# Patient Record
Sex: Male | Born: 1965 | Race: White | Hispanic: No | Marital: Married | Smoking: Never smoker
Health system: Southern US, Community
[De-identification: ages and names within clinical notes are randomized; demographics above are authoritative.]

---

## 2007-10-12 ENCOUNTER — Ambulatory Visit: Payer: Self-pay | Admitting: Gastroenterology

## 2007-11-17 ENCOUNTER — Ambulatory Visit: Payer: Self-pay | Admitting: Gastroenterology

## 2008-08-24 ENCOUNTER — Ambulatory Visit: Payer: Self-pay | Admitting: Family Medicine

## 2008-09-06 ENCOUNTER — Ambulatory Visit: Payer: Self-pay | Admitting: Surgery

## 2009-02-28 ENCOUNTER — Ambulatory Visit: Payer: Self-pay | Admitting: Gastroenterology

## 2010-03-23 ENCOUNTER — Emergency Department: Payer: Self-pay | Admitting: Emergency Medicine

## 2011-04-10 ENCOUNTER — Ambulatory Visit: Payer: Self-pay | Admitting: Family Medicine

## 2011-05-21 ENCOUNTER — Emergency Department: Payer: Self-pay | Admitting: Emergency Medicine

## 2011-05-24 ENCOUNTER — Ambulatory Visit: Payer: Self-pay | Admitting: Gastroenterology

## 2011-05-30 ENCOUNTER — Ambulatory Visit: Payer: Self-pay | Admitting: Gastroenterology

## 2012-11-04 IMAGING — CT CT ABD-PELV W/ CM
1 of 2 series · 15 of 32 positions shown, 19 images · non-contrast
Comparison: none

REASON FOR EXAM: Abd Pelvic Pain
COMMENTS:

PROCEDURE:     KCT - KCT ABDOMEN/PELVIS W  - April 10, 2011  [DATE]
RESULT:     Comparison is made to a prior study dated 09/06/2008.
TECHNIQUE: Helical 5 mm sections were obtained from the lung bases through
the pubic symphysis status post intravenous administration of 85 mL of
[id number].

[Series 3: abd with 5.0 i40f 3 · axial · 0.73mm/px · z∈[-664,-190]mm · 15 of 105 slices shown, 19 images]
[im 5/105  soft-tissue]
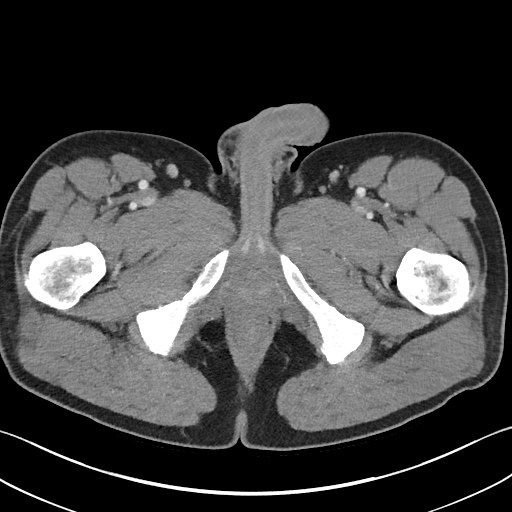
[im 5/105  bone]
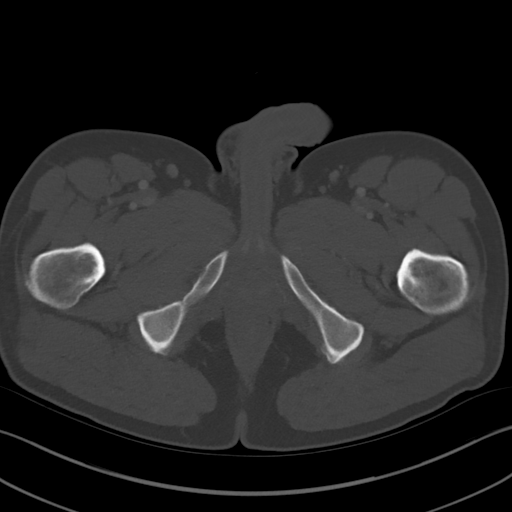
[im 14/105  soft-tissue]
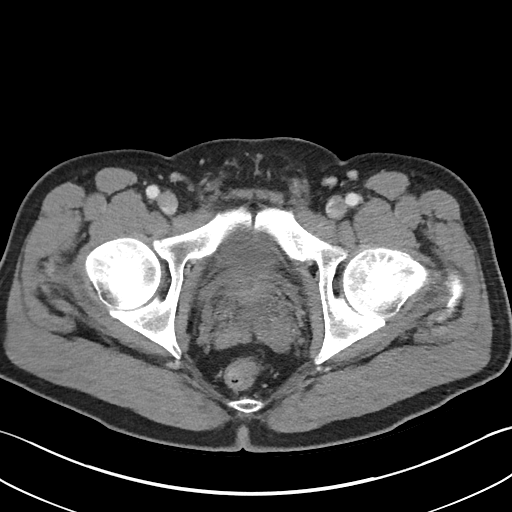
[im 23/105  soft-tissue]
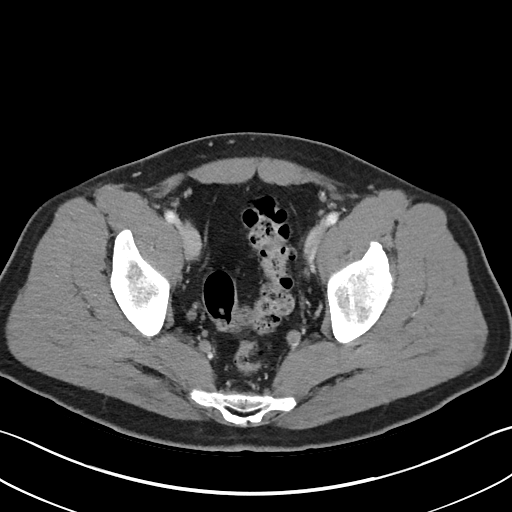
[im 28/105  soft-tissue]
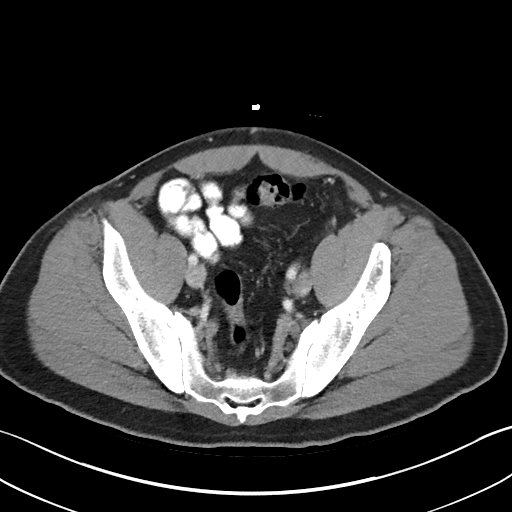
[im 37/105  soft-tissue]
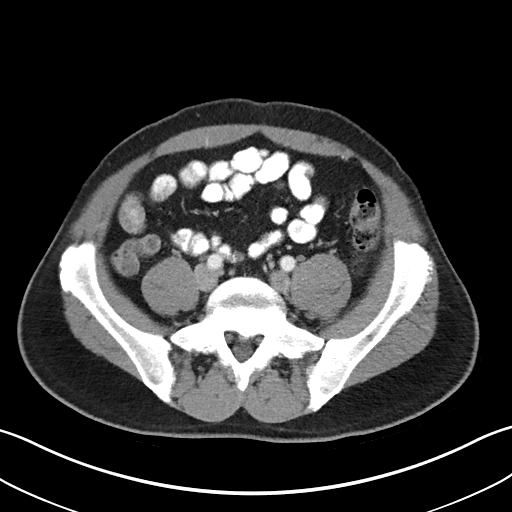
[im 46/105  soft-tissue]
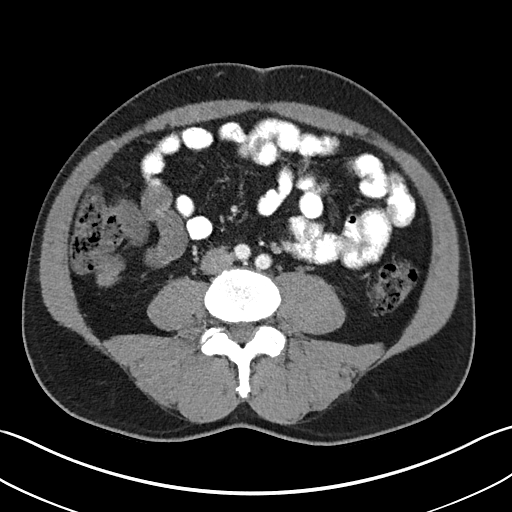
[im 55/105  soft-tissue]
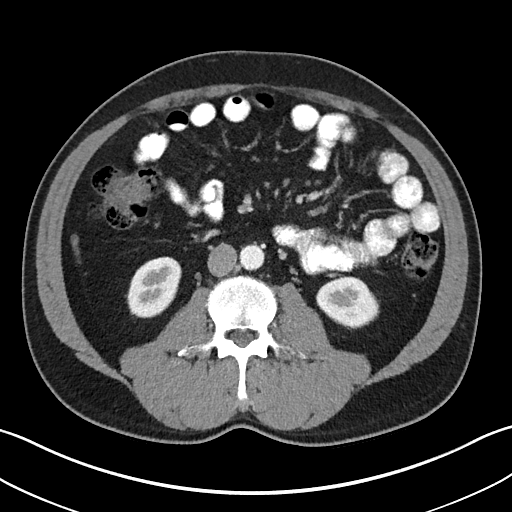
[im 59/105  soft-tissue]
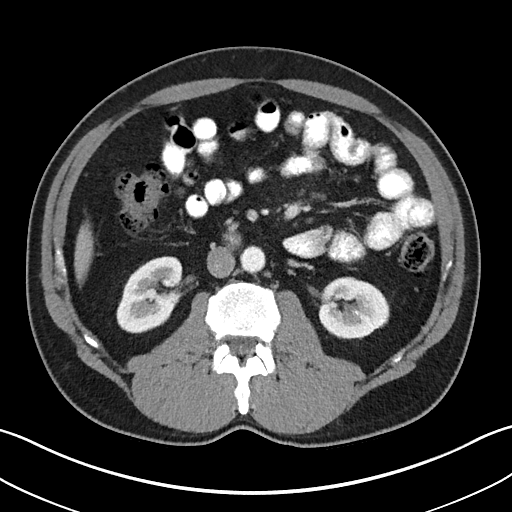
[im 68/105  soft-tissue]
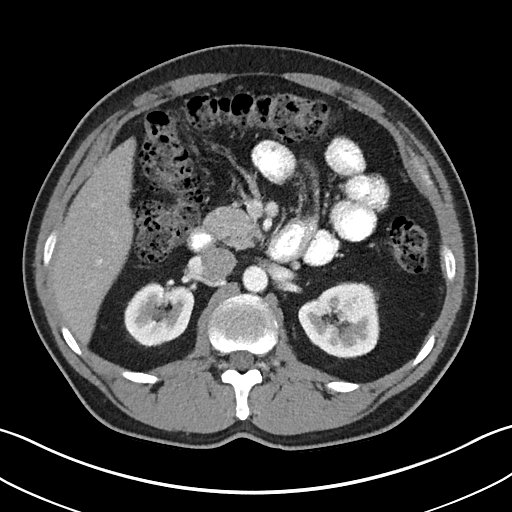
[im 68/105  bone]
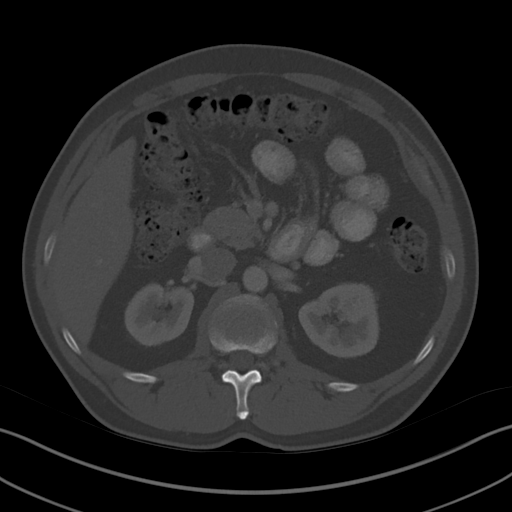
[im 77/105  soft-tissue]
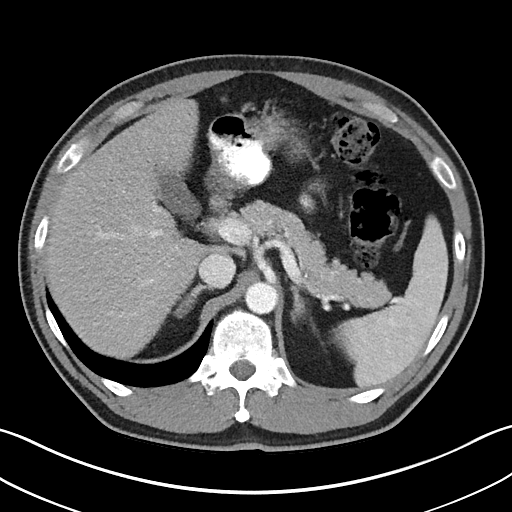
[im 82/105  soft-tissue]
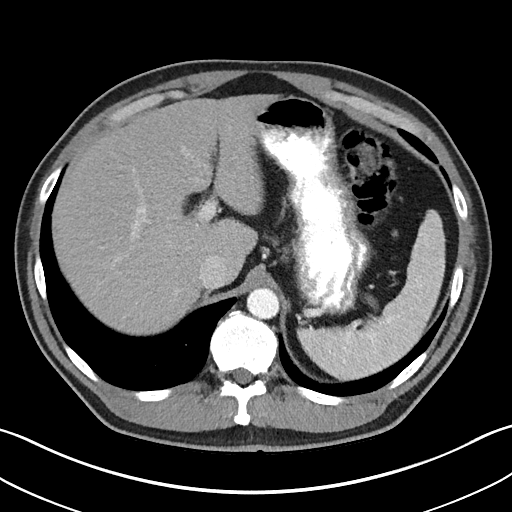
[im 86/105  lung]
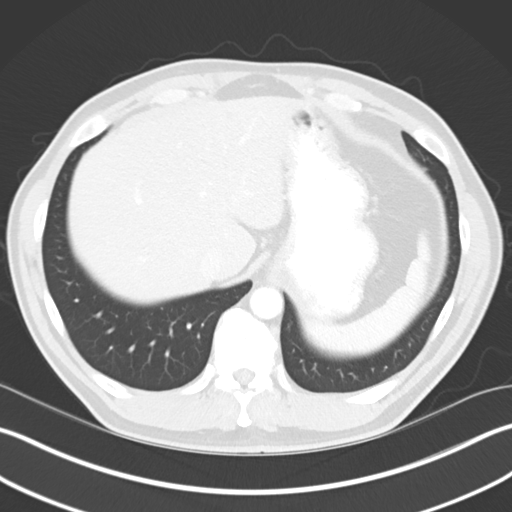
[im 91/105  soft-tissue]
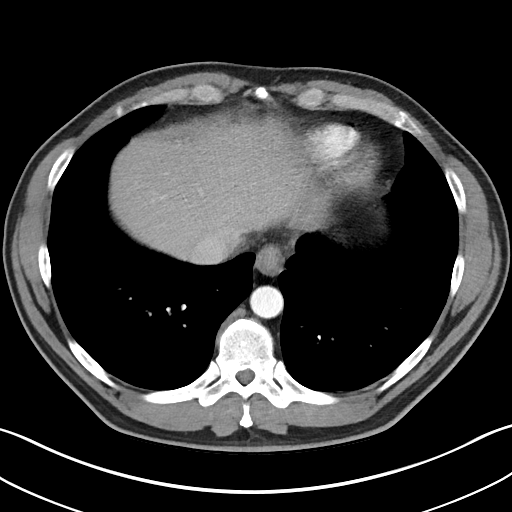
[im 91/105  lung]
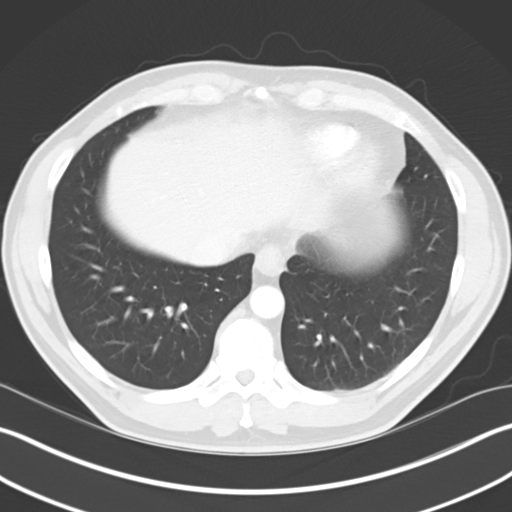
[im 95/105  lung]
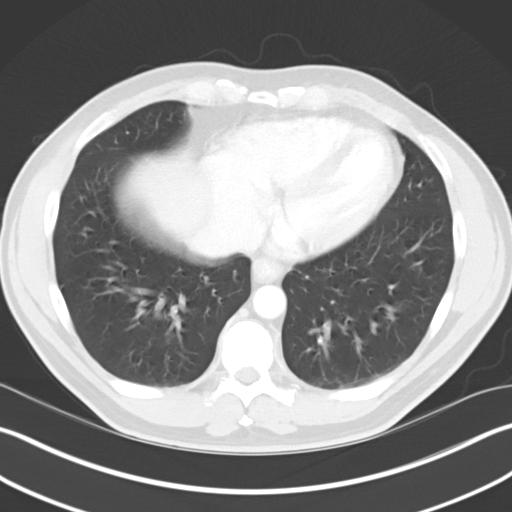
[im 100/105  soft-tissue]
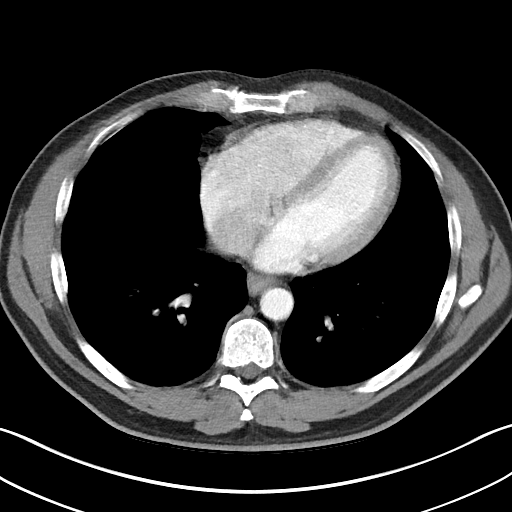
[im 100/105  lung]
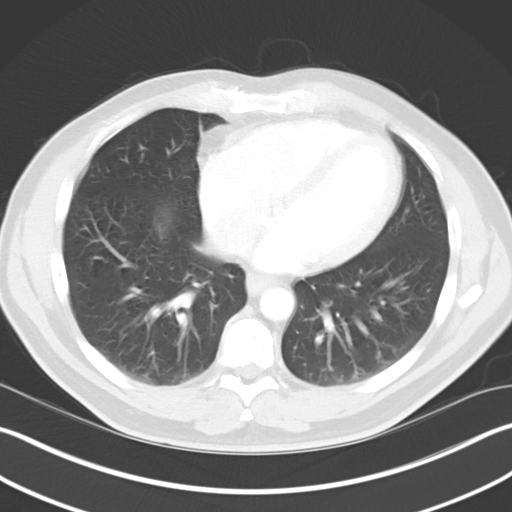

[15 of 32 positions shown; findings below may reference images not displayed]

FINDINGS: The lung bases demonstrate findings consistent with respiratory
motion artifact.

The liver, spleen, adrenals, pancreas and kidneys are unremarkable. There is
no evidence of abdominal masses, free fluid or loculated fluid collections.
There is no CT evidence of an abdominal aortic aneurysm. There is no CT
evidence of bowel obstruction or secondary signs reflecting enteritis,
colitis, diverticulitis or appendicitis. A moderate amount of stool is
appreciated within the colon. The celiac, SMA, IMA, portal vein and SMV are
opacified.
IMPRESSION: No evidence of obstructive or inflammatory abnormalities in the abdomen or
pelvis.

## 2014-03-02 ENCOUNTER — Ambulatory Visit: Payer: Self-pay | Admitting: Family Medicine

## 2015-09-19 ENCOUNTER — Other Ambulatory Visit: Payer: Self-pay | Admitting: Emergency Medicine

## 2015-09-19 DIAGNOSIS — G3184 Mild cognitive impairment, so stated: Secondary | ICD-10-CM

## 2015-09-19 MED ORDER — PHOSPHATIDYLSERINE-DHA-EPA 100-19.5-6.5 MG PO CAPS: 1.0000 | ORAL_CAPSULE | Freq: Two times a day (BID) | ORAL | Status: DC

## 2015-09-27 IMAGING — CR DG ELBOW COMPLETE 3+V*L*
1 series · 4 of 4 positions shown · non-contrast
Comparison: None.

CLINICAL DATA: Left elbow pain after fall.

EXAM:
LEFT ELBOW - COMPLETE 3+ VIEW

[Series 1: lat · 0.17mm/px · 4 of 4 slices shown]
[im 1/4]
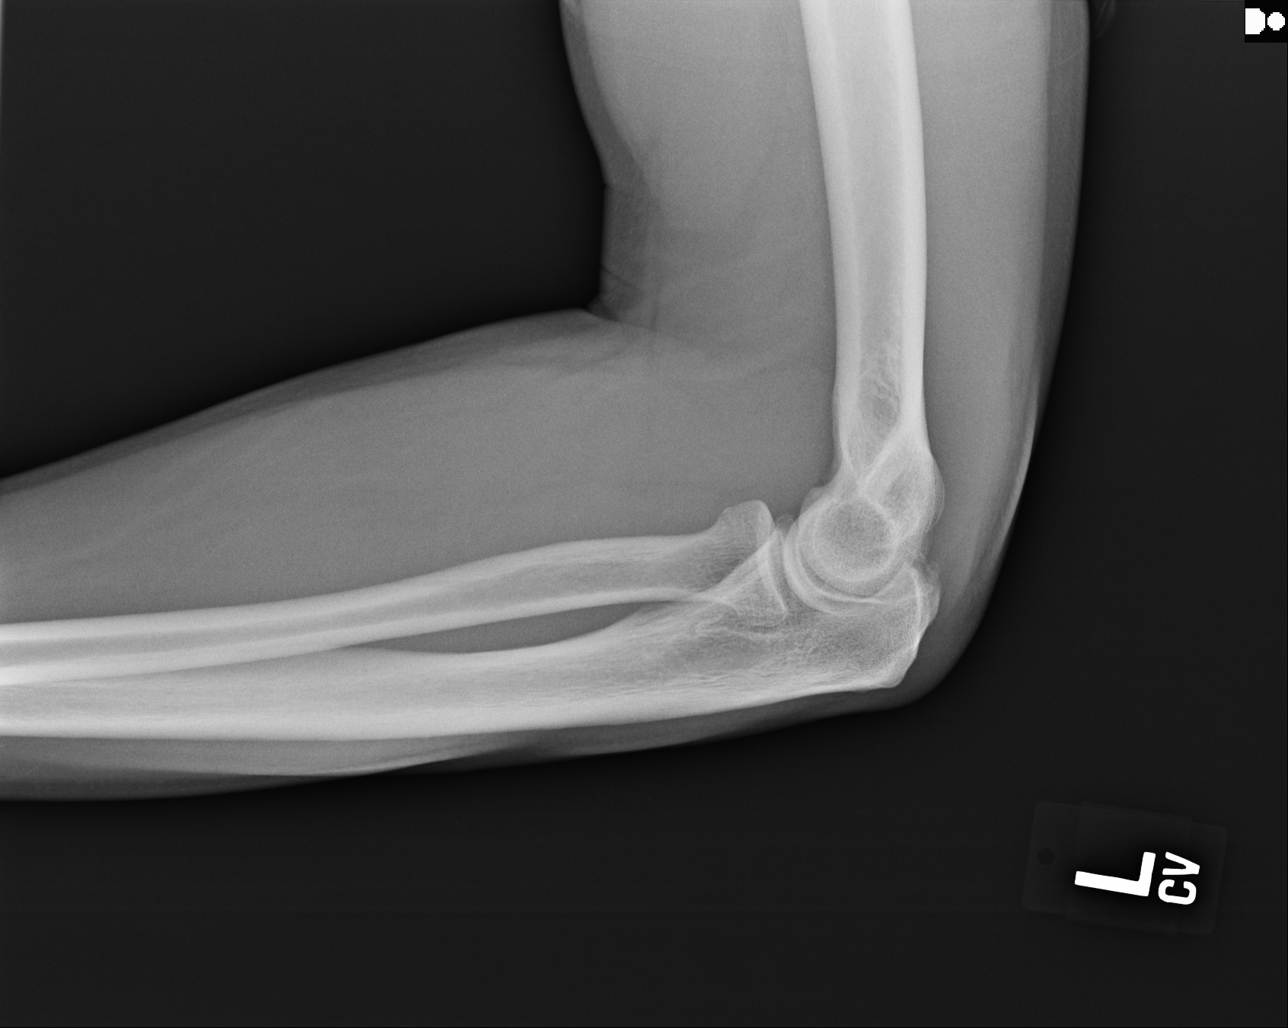
[im 2/4]
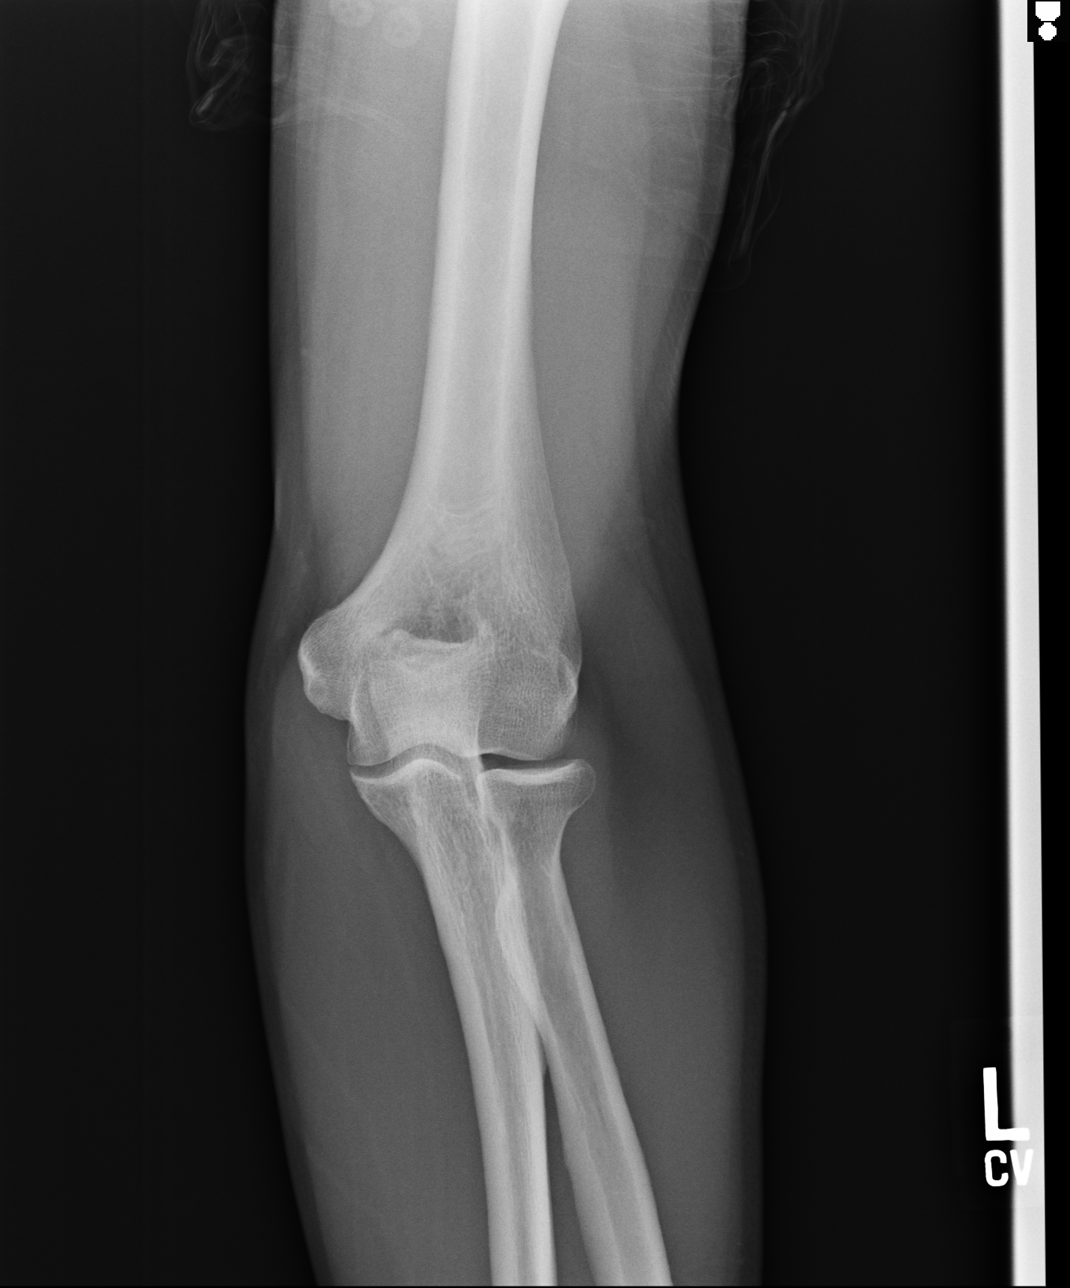
[im 3/4]
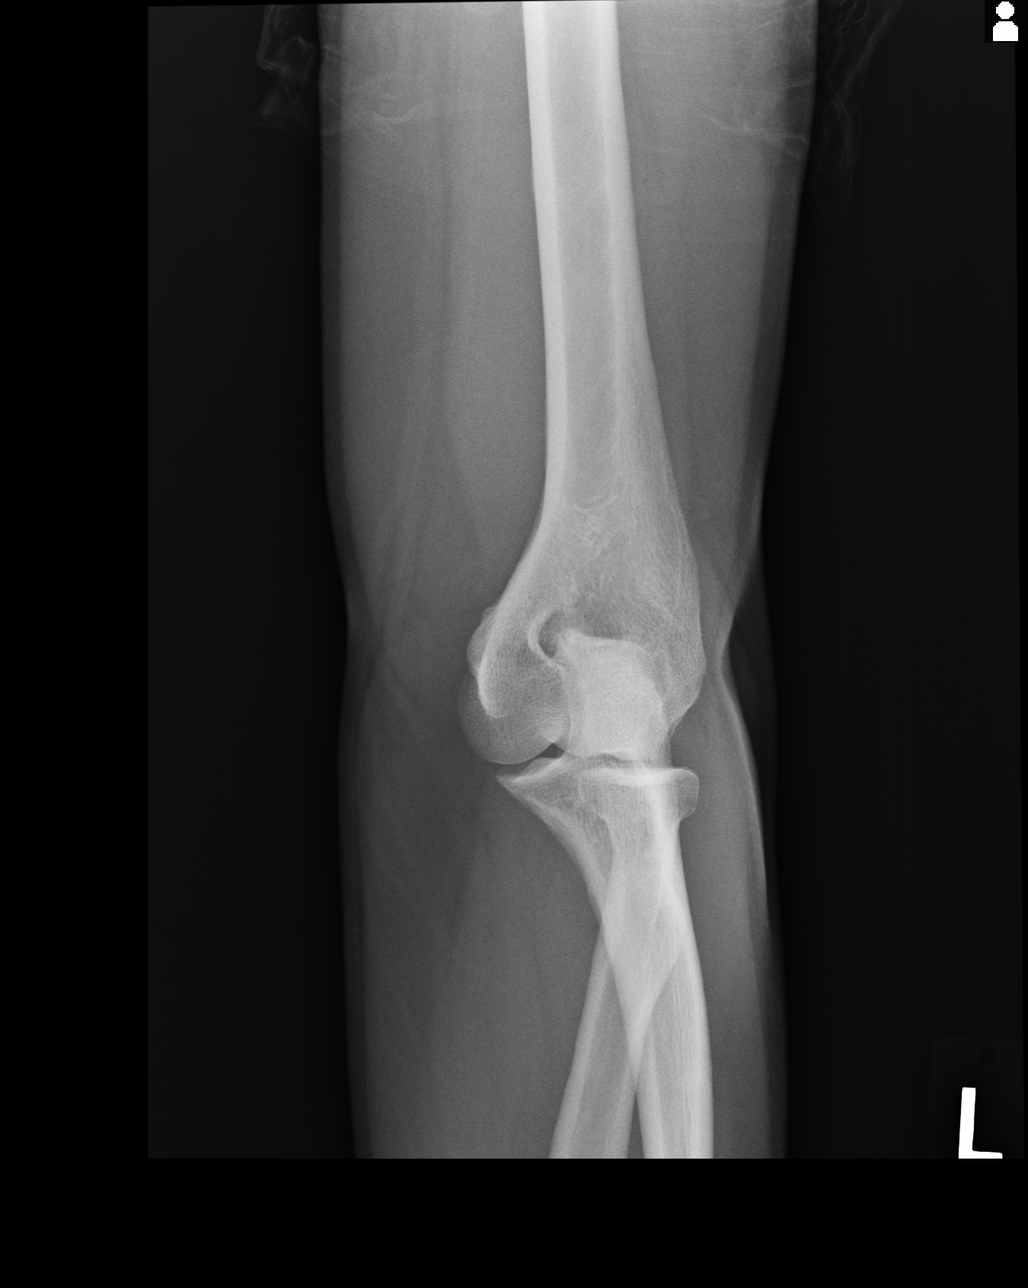
[im 4/4]
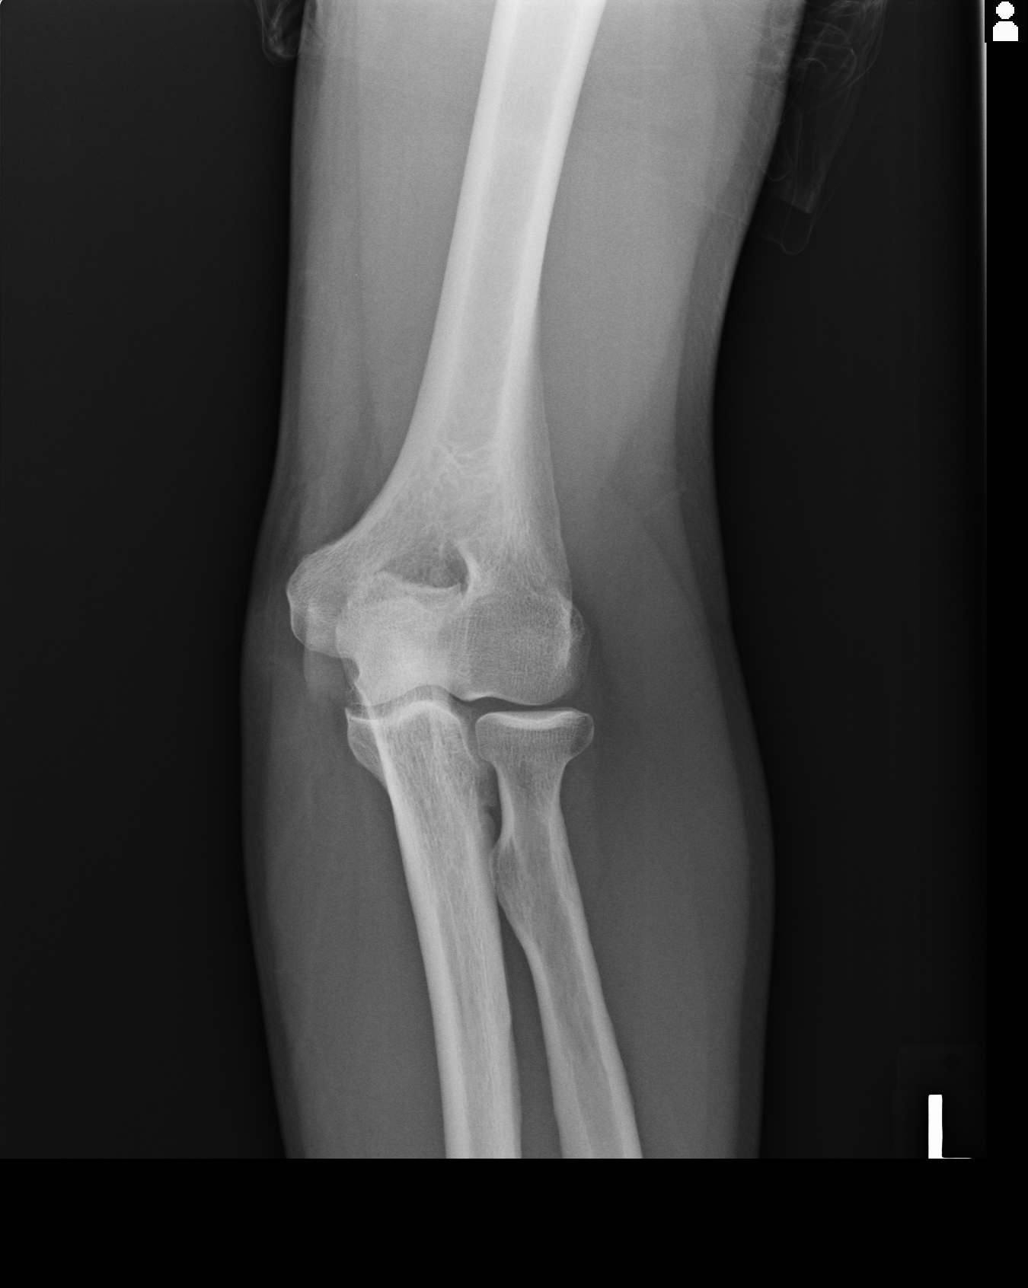

[4 of 4 positions shown; findings below may reference images not displayed]

FINDINGS: There is no evidence of fracture, dislocation, or joint effusion.
There is no evidence of arthropathy or other focal bone abnormality.
Soft tissues are unremarkable.
IMPRESSION: Normal left elbow.

## 2015-10-02 ENCOUNTER — Other Ambulatory Visit: Payer: Self-pay | Admitting: Family Medicine

## 2015-10-03 ENCOUNTER — Other Ambulatory Visit: Payer: Self-pay

## 2015-10-03 MED ORDER — LISINOPRIL 10 MG PO TABS: 10.0000 mg | ORAL_TABLET | Freq: Every day | ORAL | Status: DC

## 2015-12-09 ENCOUNTER — Other Ambulatory Visit: Payer: Self-pay | Admitting: Family Medicine

## 2016-08-06 ENCOUNTER — Other Ambulatory Visit: Payer: Self-pay | Admitting: Family Medicine

## 2016-08-09 ENCOUNTER — Other Ambulatory Visit: Payer: Self-pay | Admitting: Family Medicine

## 2016-08-12 ENCOUNTER — Other Ambulatory Visit: Payer: Self-pay

## 2016-09-09 ENCOUNTER — Other Ambulatory Visit: Payer: Self-pay | Admitting: Family Medicine

## 2016-09-13 ENCOUNTER — Telehealth: Payer: Self-pay | Admitting: Family Medicine

## 2016-09-13 NOTE — Telephone Encounter (Signed)
ERROR

## 2016-12-29 DIAGNOSIS — R05 Cough: Secondary | ICD-10-CM | POA: Diagnosis not present

## 2016-12-29 DIAGNOSIS — I10 Essential (primary) hypertension: Secondary | ICD-10-CM | POA: Diagnosis not present

## 2016-12-29 DIAGNOSIS — J0141 Acute recurrent pansinusitis: Secondary | ICD-10-CM | POA: Diagnosis not present

## 2017-02-05 ENCOUNTER — Other Ambulatory Visit: Payer: Self-pay | Admitting: Family Medicine

## 2017-02-05 NOTE — Telephone Encounter (Signed)
Patient has not been seen since we have been on Epic. ED

## 2017-02-18 DIAGNOSIS — H524 Presbyopia: Secondary | ICD-10-CM | POA: Diagnosis not present

## 2017-02-19 ENCOUNTER — Telehealth: Payer: Self-pay | Admitting: Family Medicine

## 2017-02-19 NOTE — Telephone Encounter (Signed)
LMTCB ED 

## 2017-02-19 NOTE — Telephone Encounter (Signed)
Pt called saying his blood pressure has been going up for a couple weeks 138/99 this am. 140/110 Monday.  He has an appt to see Dr, Reece Agar in May.  He wants to know if he should increase his dosage of BP meds.  His call back is [phone]  Thanks Barth Kirks

## 2017-02-19 NOTE — Telephone Encounter (Signed)
Please review, pt has not been seen since at least before 04/2015-aa

## 2017-02-19 NOTE — Telephone Encounter (Signed)
Double to --keep appt in may.

## 2017-02-19 NOTE — Telephone Encounter (Signed)
Advised  ED 

## 2017-03-07 DIAGNOSIS — D2372 Other benign neoplasm of skin of left lower limb, including hip: Secondary | ICD-10-CM | POA: Diagnosis not present

## 2017-03-07 DIAGNOSIS — D2362 Other benign neoplasm of skin of left upper limb, including shoulder: Secondary | ICD-10-CM | POA: Diagnosis not present

## 2017-03-07 DIAGNOSIS — L821 Other seborrheic keratosis: Secondary | ICD-10-CM | POA: Diagnosis not present

## 2017-03-07 DIAGNOSIS — L814 Other melanin hyperpigmentation: Secondary | ICD-10-CM | POA: Diagnosis not present

## 2017-03-24 ENCOUNTER — Encounter: Payer: Self-pay | Admitting: Family Medicine

## 2017-03-24 ENCOUNTER — Ambulatory Visit (INDEPENDENT_AMBULATORY_CARE_PROVIDER_SITE_OTHER): Payer: BLUE CROSS/BLUE SHIELD | Admitting: Family Medicine

## 2017-03-24 VITALS — BP 138/92 | HR 82 | Temp 98.6°F | Resp 16 | Ht 72.0 in | Wt 220.0 lb

## 2017-03-24 DIAGNOSIS — K219 Gastro-esophageal reflux disease without esophagitis: Secondary | ICD-10-CM | POA: Diagnosis not present

## 2017-03-24 DIAGNOSIS — Z125 Encounter for screening for malignant neoplasm of prostate: Secondary | ICD-10-CM | POA: Diagnosis not present

## 2017-03-24 DIAGNOSIS — Z0000 Encounter for general adult medical examination without abnormal findings: Secondary | ICD-10-CM

## 2017-03-24 DIAGNOSIS — R5383 Other fatigue: Secondary | ICD-10-CM

## 2017-03-24 DIAGNOSIS — J309 Allergic rhinitis, unspecified: Secondary | ICD-10-CM | POA: Insufficient documentation

## 2017-03-24 DIAGNOSIS — Z1211 Encounter for screening for malignant neoplasm of colon: Secondary | ICD-10-CM | POA: Diagnosis not present

## 2017-03-24 DIAGNOSIS — I10 Essential (primary) hypertension: Secondary | ICD-10-CM | POA: Insufficient documentation

## 2017-03-24 DIAGNOSIS — N400 Enlarged prostate without lower urinary tract symptoms: Secondary | ICD-10-CM | POA: Insufficient documentation

## 2017-03-24 LAB — POCT URINALYSIS DIPSTICK
BILIRUBIN UA: NEGATIVE
Blood, UA: NEGATIVE
GLUCOSE UA: NEGATIVE
KETONES UA: NEGATIVE
Leukocytes, UA: NEGATIVE
Nitrite, UA: NEGATIVE
Protein, UA: NEGATIVE
SPEC GRAV UA: 1.01 (ref 1.010–1.025)
UROBILINOGEN UA: 0.2 U/dL
pH, UA: 6 (ref 5.0–8.0)

## 2017-03-24 LAB — HEMOCCULT GUIAC POC 1CARD (OFFICE): FECAL OCCULT BLD: NEGATIVE

## 2017-03-24 MED ORDER — LISINOPRIL 40 MG PO TABS: 40.0000 mg | ORAL_TABLET | Freq: Every day | ORAL | 3 refills | Status: DC

## 2017-03-24 NOTE — Progress Notes (Signed)
Patient: Matthew Miller Dicostanzo, Male    DOB: 11/23/1965, 51 y.o.   MRN: [id number] Visit Date: 03/24/2017  Today's Provider: Megan Mansichard  Jr, MD   Chief Complaint  Patient presents with  . Annual Exam   Subjective:    Annual physical exam Matthew Miller Bonfanti is a 51 y.o. male who presents today for health maintenance and complete physical. He feels well. He reports he is not exercising. He reports he is sleeping fairly well. Patient now [place] in [place] with his wife and 3 children. 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 He has a daughter at [employer] in grad school, a son at [employer] in undergraduate and a daughter that is  49 years old. Overall he feels well.    Review of Systems  Constitutional: Positive for fatigue.       Mild fatigue as he ages.  HENT: Negative.   Eyes: Negative.   Respiratory: Positive for shortness of breath (deconditioning ).   Cardiovascular: Negative.   Gastrointestinal: Positive for constipation.  Endocrine: Negative.   Genitourinary: Negative.   Musculoskeletal: Negative.   Skin: Negative.   Allergic/Immunologic: Negative.   Neurological: Negative.   Hematological: Negative.   Psychiatric/Behavioral: The patient is nervous/anxious.     Social History      He  reports that he has never smoked. He uses smokeless tobacco. He reports that he drinks alcohol. He reports that he does not use drugs.       Social History   Social History  . Marital status: Married    Spouse name: N/A  . Number of children: N/A  . Years of education: N/A   Social History Main Topics  . Smoking status: Never Smoker  . Smokeless tobacco: Current User     Comment: dip, occasionally when outside.  . Alcohol use Yes     Comment: "couple nights a weel"  . Drug use: No  . Sexual activity: Not Asked   Other Topics Concern  . None   Social History Narrative  . None    History reviewed. No pertinent past medical  history.   Patient Active Problem List   Diagnosis Date Noted  . Allergic rhinitis 03/24/2017  . 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 Acid reflux 03/24/2017  . BP (high blood pressure) 03/24/2017    History reviewed. No pertinent surgical history.  Family History        Family Status  Relation Status  . Mother Alive  . Father Alive  . Sister Alive  . Sister Alive        His family history includes COPD in his father; Hypertension in his mother.     Not on File   Current Outpatient Prescriptions:  .  lisinopril (PRINIVIL,ZESTRIL) 10 MG tablet, TAKE 1 TABLET ONCE DAILY. (Patient taking differently: TAKE 1 TABLET ONCE DAILY. pt taking 20 mg), Disp: 30 tablet, Rfl: 1 .  omeprazole (PRILOSEC) 40 MG capsule, TAKE (1) CAPSULE DAILY., Disp: 30 capsule, Rfl: 12 .  MULTIPLE VITAMIN PO, Take by mouth., Disp: , Rfl:  .  Phosphatidylserine-DHA-EPA (VAYACOG) 100-19.5-6.5 MG CAPS, Take 1 capsule by mouth 2 (two) times daily. (Patient not taking: Reported on 03/24/2017), Disp: 60 capsule, Rfl: 12   Patient Care Team: Maple Hudson, Afifa Truax L Jr., MD as PCP - General (Family Medicine)      Objective:   Vitals: BP (!) 138/92 (BP Location: Left Arm, Patient Position: Sitting, Cuff Size: Large)  Pulse 82   Temp 98.6 F (37 C) (Oral)   Resp 16   Ht 6' (1.829 m)   Wt 220 lb (99.8 kg)   BMI 29.84 kg/m    Vitals:   03/24/17 0912  BP: (!) 138/92  Pulse: 82  Resp: 16  Temp: 98.6 F (37 C)  TempSrc: Oral  Weight: 220 lb (99.8 kg)  Height: 6' (1.829 m)     Physical Exam  Constitutional: He is oriented to person, place, and time. He appears well-developed and well-nourished.  HENT:  Head: Normocephalic and atraumatic.  Right Ear: External ear normal.  Left Ear: External ear normal.  Nose: Nose normal.  Mouth/Throat: Oropharynx is clear and moist.  Eyes: Conjunctivae and EOM are normal. Pupils are equal, round, and reactive to light.  Neck: Normal range of motion. Neck supple.    Cardiovascular: Normal rate, regular rhythm, normal heart sounds and intact distal pulses.   Pulmonary/Chest: Effort normal and breath sounds normal.  Abdominal: Soft. Bowel sounds are normal.  Genitourinary: Rectum normal, prostate normal and penis normal.  Musculoskeletal: Normal range of motion.  Neurological: He is alert and oriented to person, place, and time. He has normal reflexes.  Skin: Skin is warm and dry.  Psychiatric: He has a normal mood and affect. His behavior is normal. Judgment and thought content normal.     Depression Screen PHQ 2/9 Scores 03/24/2017  PHQ - 2 Score 0  PHQ- 9 Score 1      Assessment & Plan:   1. Annual physical exam - CBC with Differential/Platelet - Comprehensive metabolic panel - Lipid Panel With LDL/HDL Ratio - TSH - POCT Urinalysis Dipstick  2. Prostate cancer screening - PSA  3. Other fatigue Epworth sleepiness scale score 6 today.  Possible early OSA with weight gain. Check testosterone leve. - Testosterone  4. Essential hypertension Increase Lisinopril to 40 mg daily.  5. Gastroesophageal reflux disease, esophagitis presence not specified  6. Colon cancer screening - POCT Occult Blood Stool    I have done the exam and reviewed the above chart and it is accurate to the best of my knowledge. Dentist has been used in this note in any air is in the dictation or transcription are unintentional.  Megan Mans, MD  [facility]

## 2017-03-25 LAB — CBC WITH DIFFERENTIAL/PLATELET
BASOS: 1 %
Basophils Absolute: 0 10*3/uL (ref 0.0–0.2)
EOS (ABSOLUTE): 0.2 10*3/uL (ref 0.0–0.4)
Eos: 4 %
Hematocrit: 43.6 % (ref 37.5–51.0)
Hemoglobin: 15 g/dL (ref 13.0–17.7)
IMMATURE GRANULOCYTES: 0 %
Immature Grans (Abs): 0 10*3/uL (ref 0.0–0.1)
Lymphocytes Absolute: 1.5 10*3/uL (ref 0.7–3.1)
Lymphs: 27 %
MCH: 31.9 pg (ref 26.6–33.0)
MCHC: 34.4 g/dL (ref 31.5–35.7)
MCV: 93 fL (ref 79–97)
MONOS ABS: 0.4 10*3/uL (ref 0.1–0.9)
Monocytes: 7 %
NEUTROS PCT: 61 %
Neutrophils Absolute: 3.4 10*3/uL (ref 1.4–7.0)
PLATELETS: 276 10*3/uL (ref 150–379)
RBC: 4.7 x10E6/uL (ref 4.14–5.80)
RDW: 13.9 % (ref 12.3–15.4)
WBC: 5.5 10*3/uL (ref 3.4–10.8)

## 2017-03-25 LAB — COMPREHENSIVE METABOLIC PANEL
ALBUMIN: 4.7 g/dL (ref 3.5–5.5)
ALT: 44 IU/L (ref 0–44)
AST: 27 IU/L (ref 0–40)
Albumin/Globulin Ratio: 1.8 (ref 1.2–2.2)
Alkaline Phosphatase: 66 IU/L (ref 39–117)
BILIRUBIN TOTAL: 0.4 mg/dL (ref 0.0–1.2)
BUN / CREAT RATIO: 14 (ref 9–20)
BUN: 14 mg/dL (ref 6–24)
CALCIUM: 9.6 mg/dL (ref 8.7–10.2)
CHLORIDE: 101 mmol/L (ref 96–106)
CO2: 24 mmol/L (ref 18–29)
CREATININE: 1 mg/dL (ref 0.76–1.27)
GFR calc non Af Amer: 89 mL/min/{1.73_m2} (ref >59)
GFR, EST AFRICAN AMERICAN: 102 mL/min/{1.73_m2} (ref >59)
GLUCOSE: 107 mg/dL — AB (ref 65–99)
Globulin, Total: 2.6 g/dL (ref 1.5–4.5)
Potassium: 5 mmol/L (ref 3.5–5.2)
Sodium: 143 mmol/L (ref 134–144)
Total Protein: 7.3 g/dL (ref 6.0–8.5)

## 2017-03-25 LAB — LIPID PANEL WITH LDL/HDL RATIO
Cholesterol, Total: 202 mg/dL — ABNORMAL HIGH (ref 100–199)
HDL: 43 mg/dL (ref >39)
LDL CALC: 129 mg/dL — AB (ref 0–99)
LDl/HDL Ratio: 3 ratio (ref 0.0–3.6)
Triglycerides: 149 mg/dL (ref 0–149)
VLDL CHOLESTEROL CAL: 30 mg/dL (ref 5–40)

## 2017-03-25 LAB — PSA: PROSTATE SPECIFIC AG, SERUM: 0.6 ng/mL (ref 0.0–4.0)

## 2017-03-25 LAB — TESTOSTERONE: Testosterone: 374 ng/dL (ref 264–916)

## 2017-03-25 LAB — TSH: TSH: 1.39 u[IU]/mL (ref 0.450–4.500)

## 2017-07-21 ENCOUNTER — Other Ambulatory Visit: Payer: Self-pay | Admitting: Family Medicine

## 2017-11-09 DIAGNOSIS — I10 Essential (primary) hypertension: Secondary | ICD-10-CM | POA: Diagnosis not present

## 2017-11-10 DIAGNOSIS — I10 Essential (primary) hypertension: Secondary | ICD-10-CM | POA: Diagnosis not present

## 2017-11-10 DIAGNOSIS — Z6829 Body mass index (BMI) 29.0-29.9, adult: Secondary | ICD-10-CM | POA: Diagnosis not present

## 2017-11-10 DIAGNOSIS — Z0130 Encounter for examination of blood pressure without abnormal findings: Secondary | ICD-10-CM | POA: Diagnosis not present

## 2017-11-10 DIAGNOSIS — Z713 Dietary counseling and surveillance: Secondary | ICD-10-CM | POA: Diagnosis not present

## 2017-11-10 DIAGNOSIS — Z0131 Encounter for examination of blood pressure with abnormal findings: Secondary | ICD-10-CM | POA: Diagnosis not present

## 2017-11-10 DIAGNOSIS — R5383 Other fatigue: Secondary | ICD-10-CM | POA: Diagnosis not present

## 2018-03-19 ENCOUNTER — Other Ambulatory Visit: Payer: Self-pay | Admitting: Family Medicine

## 2018-03-30 ENCOUNTER — Ambulatory Visit (INDEPENDENT_AMBULATORY_CARE_PROVIDER_SITE_OTHER): Payer: BLUE CROSS/BLUE SHIELD | Admitting: Family Medicine

## 2018-03-30 VITALS — BP 118/70 | HR 62 | Temp 98.0°F | Resp 16 | Wt 201.0 lb

## 2018-03-30 DIAGNOSIS — I10 Essential (primary) hypertension: Secondary | ICD-10-CM | POA: Diagnosis not present

## 2018-03-30 DIAGNOSIS — Z800 Family history of malignant neoplasm of digestive organs: Secondary | ICD-10-CM

## 2018-03-30 DIAGNOSIS — Z125 Encounter for screening for malignant neoplasm of prostate: Secondary | ICD-10-CM | POA: Diagnosis not present

## 2018-03-30 DIAGNOSIS — G4709 Other insomnia: Secondary | ICD-10-CM | POA: Diagnosis not present

## 2018-03-30 DIAGNOSIS — Z0000 Encounter for general adult medical examination without abnormal findings: Secondary | ICD-10-CM

## 2018-03-30 LAB — POCT URINALYSIS DIPSTICK
BILIRUBIN UA: NEGATIVE
Glucose, UA: NEGATIVE
KETONES UA: NEGATIVE
Leukocytes, UA: NEGATIVE
Nitrite, UA: NEGATIVE
PH UA: 6 (ref 5.0–8.0)
Protein, UA: NEGATIVE
RBC UA: NEGATIVE
SPEC GRAV UA: 1.015 (ref 1.010–1.025)
UROBILINOGEN UA: 0.2 U/dL

## 2018-03-30 MED ORDER — MELATONIN 5 MG PO CAPS: 1.0000 | ORAL_CAPSULE | Freq: Every evening | ORAL | 0 refills | Status: AC | PRN

## 2018-03-30 NOTE — Progress Notes (Signed)
Patient: Matthew Miller, Male    DOB: 09-Jan-1966, 52 y.o.   MRN: [id number] Visit Date: 03/30/2018  Today's Provider: Megan Mans, MD   Chief Complaint  Patient presents with  . Annual Exam   Subjective:  Matthew Miller is a 52 y.o. male who presents today for health maintenance and complete physical. He feels well. He reports exercising daily. He reports he is sleeping poorly.  This year his father has developed colon cancer at about age 20.  Earlier this year he quit drinking alcohol and starting exercising daily and has lost 19 pounds from 1 year ago. He feels well and his family is doing well. He is enjoying living in [place].  Review of Systems  Constitutional: Negative.   HENT: Negative.   Eyes: Negative.   Respiratory: Negative.   Cardiovascular: Negative.   Gastrointestinal: Negative.   Endocrine: Negative.   Genitourinary: Negative.   Musculoskeletal: Negative.   Skin: Negative.   Allergic/Immunologic: Negative.   Neurological: Negative.   Hematological: Negative.   Psychiatric/Behavioral: Positive for sleep disturbance.    Social History   Socioeconomic History  . Marital status: Married    Spouse name: Not on file  . Number of children: Not on file  . Years of education: Not on file  . Highest education level: Not on file  Occupational History  . Not on file  Social Needs  . Financial resource strain: Not on file  . Food insecurity:    Worry: Not on file    Inability: Not on file  . Transportation needs:    Medical: Not on file    Non-medical: Not on file  Tobacco Use  . Smoking status: Never Smoker  . Smokeless tobacco: Current User  . Tobacco comment: dip, occasionally when outside.  Substance and Sexual Activity  . Alcohol use: Yes    Comment: "couple nights a weel"  . Drug use: No  . Sexual activity: Not on file  Lifestyle  . Physical activity:    Days per week: Not on file    Minutes per session: Not on file  . Stress: Not on  file  Relationships  . Social connections:    Talks on phone: Not on file    Gets together: Not on file    Attends religious service: Not on file    Active member of club or organization: Not on file    Attends meetings of clubs or organizations: Not on file    Relationship status: Not on file  . Intimate partner violence:    Fear of current or ex partner: Not on file    Emotionally abused: Not on file    Physically abused: Not on file    Forced sexual activity: Not on file  Other Topics Concern  . Not on file  Social History Narrative  . Not on file    Patient Active Problem List   Diagnosis Date Noted  . Allergic rhinitis 03/24/2017  . 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 Acid reflux 03/24/2017  . BP (high blood pressure) 03/24/2017    No past surgical history on file.  His family history includes COPD in his father; Hypertension in his mother.     Outpatient Encounter Medications as of 03/30/2018  Medication Sig  . amLODipine (NORVASC) 10 MG tablet Take 10 mg by mouth daily.  Marland Kitchen lisinopril (PRINIVIL,ZESTRIL) 40 MG tablet TAKE (1) TABLET DAILY FOR HIGH BLOOD PRESSURE.  . MULTIPLE VITAMIN PO Take by mouth.  Marland Kitchen omeprazole (  PRILOSEC) 40 MG capsule TAKE (1) CAPSULE DAILY.  . [DISCONTINUED] lisinopril (PRINIVIL,ZESTRIL) 10 MG tablet TAKE 1 TABLET ONCE DAILY.  . [DISCONTINUED] Phosphatidylserine-DHA-EPA (VAYACOG) 100-19.5-6.5 MG CAPS Take 1 capsule by mouth 2 (two) times daily. (Patient not taking: Reported on 03/24/2017)   No facility-administered encounter medications on file as of 03/30/2018.     Patient Care Team: Maple Hudson., MD as PCP - General (Family Medicine)      Objective:   Vitals:  Vitals:   03/30/18 0908  BP: 118/70  Pulse: 62  Resp: 16  Temp: 98 F (36.7 C)  TempSrc: Oral  Weight: 201 lb (91.2 kg)    Physical Exam  Constitutional: He is oriented to person, place, and time. He appears well-developed and well-nourished.  HENT:  Head:  Normocephalic and atraumatic.  Right Ear: External ear normal.  Left Ear: External ear normal.  Nose: Nose normal.  Mouth/Throat: Oropharynx is clear and moist.  Eyes: Pupils are equal, round, and reactive to light. Conjunctivae and EOM are normal.  Neck: Normal range of motion. Neck supple.  Cardiovascular: Normal rate, regular rhythm, normal heart sounds and intact distal pulses.  Pulmonary/Chest: Effort normal and breath sounds normal.  Abdominal: Soft. Bowel sounds are normal.  Genitourinary: Penis normal.  Musculoskeletal: Normal range of motion.  Neurological: He is alert and oriented to person, place, and time.  Skin: Skin is warm and dry.  Psychiatric: He has a normal mood and affect. His behavior is normal. Judgment and thought content normal.     Depression Screen PHQ 2/9 Scores 03/30/2018 03/24/2017  PHQ - 2 Score 0 0  PHQ- 9 Score - 1      Assessment & Plan:     Routine Health Maintenance and Physical Exam  Exercise Activities and Dietary recommendations Goals    None       There is no immunization history on file for this patient.  Health Maintenance  Topic Date Due  . HIV Screening  09/17/1983  . TETANUS/TDAP  09/17/1987  . INFLUENZA VACCINE  06/11/2018     Discussed health benefits of physical activity, and encouraged him to engage in regular exercise appropriate for his age and condition.    1. Annual physical exam  - CBC with Differential/Platelet - Comprehensive metabolic panel - Lipid Panel With LDL/HDL Ratio - TSH  2. Prostate cancer screening  - PSA  3. Family history of colon cancer Pt will turn 52 later this year. - Ambulatory referral to Gastroenterology  4. Other insomnia  - Melatonin 5 MG CAPS; Take 1 capsule (5 mg total) by mouth at bedtime as needed.  Dispense: 30 capsule; Refill: 0  5. Essential hypertension Discontinue Amlodipine  I have done the exam and reviewed the chart and it is accurate to the best of my  knowledge. Dentist has been used and  any errors in dictation or transcription are unintentional. Julieanne Manson M.D. [facility]

## 2018-03-31 LAB — COMPREHENSIVE METABOLIC PANEL
A/G RATIO: 2 (ref 1.2–2.2)
ALT: 29 IU/L (ref 0–44)
AST: 24 IU/L (ref 0–40)
Albumin: 4.7 g/dL (ref 3.5–5.5)
Alkaline Phosphatase: 69 IU/L (ref 39–117)
BILIRUBIN TOTAL: 0.5 mg/dL (ref 0.0–1.2)
BUN / CREAT RATIO: 10 (ref 9–20)
BUN: 11 mg/dL (ref 6–24)
CHLORIDE: 99 mmol/L (ref 96–106)
CO2: 21 mmol/L (ref 20–29)
Calcium: 9.8 mg/dL (ref 8.7–10.2)
Creatinine, Ser: 1.12 mg/dL (ref 0.76–1.27)
GFR calc non Af Amer: 77 mL/min/{1.73_m2} (ref >59)
GFR, EST AFRICAN AMERICAN: 89 mL/min/{1.73_m2} (ref >59)
Globulin, Total: 2.4 g/dL (ref 1.5–4.5)
Glucose: 92 mg/dL (ref 65–99)
POTASSIUM: 5.2 mmol/L (ref 3.5–5.2)
Sodium: 136 mmol/L (ref 134–144)
TOTAL PROTEIN: 7.1 g/dL (ref 6.0–8.5)

## 2018-03-31 LAB — CBC WITH DIFFERENTIAL/PLATELET
BASOS: 1 %
Basophils Absolute: 0 10*3/uL (ref 0.0–0.2)
EOS (ABSOLUTE): 0.2 10*3/uL (ref 0.0–0.4)
Eos: 4 %
Hematocrit: 41.1 % (ref 37.5–51.0)
Hemoglobin: 14.1 g/dL (ref 13.0–17.7)
IMMATURE GRANS (ABS): 0 10*3/uL (ref 0.0–0.1)
Immature Granulocytes: 0 %
Lymphocytes Absolute: 1.7 10*3/uL (ref 0.7–3.1)
Lymphs: 33 %
MCH: 31.7 pg (ref 26.6–33.0)
MCHC: 34.3 g/dL (ref 31.5–35.7)
MCV: 92 fL (ref 79–97)
MONOS ABS: 0.6 10*3/uL (ref 0.1–0.9)
Monocytes: 12 %
NEUTROS ABS: 2.6 10*3/uL (ref 1.4–7.0)
Neutrophils: 50 %
Platelets: 336 10*3/uL (ref 150–450)
RBC: 4.45 x10E6/uL (ref 4.14–5.80)
RDW: 13.1 % (ref 12.3–15.4)
WBC: 5 10*3/uL (ref 3.4–10.8)

## 2018-03-31 LAB — LIPID PANEL WITH LDL/HDL RATIO
Cholesterol, Total: 138 mg/dL (ref 100–199)
HDL: 41 mg/dL (ref >39)
LDL Calculated: 82 mg/dL (ref 0–99)
LDl/HDL Ratio: 2 ratio (ref 0.0–3.6)
Triglycerides: 74 mg/dL (ref 0–149)
VLDL Cholesterol Cal: 15 mg/dL (ref 5–40)

## 2018-03-31 LAB — TSH: TSH: 1.39 u[IU]/mL (ref 0.450–4.500)

## 2018-03-31 LAB — PSA: PROSTATE SPECIFIC AG, SERUM: 0.5 ng/mL (ref 0.0–4.0)

## 2018-06-03 ENCOUNTER — Other Ambulatory Visit: Payer: Self-pay | Admitting: Family Medicine

## 2018-06-29 ENCOUNTER — Ambulatory Visit: Payer: Self-pay | Admitting: Family Medicine

## 2018-07-30 ENCOUNTER — Telehealth: Payer: Self-pay | Admitting: Family Medicine

## 2018-07-31 NOTE — Telephone Encounter (Signed)
error 

## 2018-08-25 ENCOUNTER — Other Ambulatory Visit: Payer: Self-pay | Admitting: Family Medicine

## 2018-08-25 NOTE — Telephone Encounter (Signed)
Pharmacy requesting refills. Thanks!  

## 2018-10-13 ENCOUNTER — Encounter: Payer: Self-pay | Admitting: Family Medicine

## 2018-10-13 DIAGNOSIS — D125 Benign neoplasm of sigmoid colon: Secondary | ICD-10-CM | POA: Diagnosis not present

## 2018-10-13 DIAGNOSIS — K5730 Diverticulosis of large intestine without perforation or abscess without bleeding: Secondary | ICD-10-CM | POA: Diagnosis not present

## 2018-10-13 DIAGNOSIS — K210 Gastro-esophageal reflux disease with esophagitis: Secondary | ICD-10-CM | POA: Diagnosis not present

## 2018-10-13 DIAGNOSIS — Z1211 Encounter for screening for malignant neoplasm of colon: Secondary | ICD-10-CM | POA: Diagnosis not present

## 2018-10-13 DIAGNOSIS — K449 Diaphragmatic hernia without obstruction or gangrene: Secondary | ICD-10-CM | POA: Diagnosis not present

## 2018-10-13 DIAGNOSIS — K219 Gastro-esophageal reflux disease without esophagitis: Secondary | ICD-10-CM | POA: Diagnosis not present

## 2018-10-13 DIAGNOSIS — D123 Benign neoplasm of transverse colon: Secondary | ICD-10-CM | POA: Diagnosis not present

## 2018-10-13 LAB — HM COLONOSCOPY

## 2018-10-31 ENCOUNTER — Other Ambulatory Visit: Payer: Self-pay | Admitting: Family Medicine

## 2019-03-23 ENCOUNTER — Other Ambulatory Visit: Payer: Self-pay | Admitting: Family Medicine

## 2019-04-27 DIAGNOSIS — Z03818 Encounter for observation for suspected exposure to other biological agents ruled out: Secondary | ICD-10-CM | POA: Diagnosis not present

## 2019-06-25 ENCOUNTER — Other Ambulatory Visit: Payer: Self-pay | Admitting: Family Medicine

## 2019-09-27 ENCOUNTER — Other Ambulatory Visit: Payer: Self-pay | Admitting: Family Medicine

## 2019-10-01 DIAGNOSIS — Z20828 Contact with and (suspected) exposure to other viral communicable diseases: Secondary | ICD-10-CM | POA: Diagnosis not present

## 2019-10-25 ENCOUNTER — Other Ambulatory Visit: Payer: Self-pay | Admitting: Family Medicine

## 2019-10-25 NOTE — Telephone Encounter (Signed)
Requested medication (s) are due for refill today YES  Requested medication (s) are on the active medication list    YES  Future visit scheduled  NO. No appointment in the last 18 months. Last OV  03/30/18.   Does not meet protocol. Routing to PCP for further consideration.   Requested Prescriptions  Pending Prescriptions Disp Refills   lisinopril (ZESTRIL) 40 MG tablet [Pharmacy Med Name: LISINOPRIL 40 MG TABLET] 90 tablet 0    Sig: TAKE (1) TABLET DAILY FOR HIGH BLOOD PRESSURE.      Cardiovascular:  ACE Inhibitors Failed - 10/25/2019  2:32 PM      Failed - Cr in normal range and within 180 days    Creatinine, Ser  Date Value Ref Range Status  03/30/2018 1.12 0.76 - 1.27 mg/dL Final          Failed - K in normal range and within 180 days    Potassium  Date Value Ref Range Status  03/30/2018 5.2 3.5 - 5.2 mmol/L Final          Failed - Valid encounter within last 6 months    Recent Outpatient Visits           1 year ago Annual physical exam   [facility] Jerrol Banana., MD   2 years ago Annual physical exam   [facility] Jerrol Banana., MD              Passed - Patient is not pregnant      Passed - Last BP in normal range    BP Readings from Last 1 Encounters:  03/30/18 118/70

## 2019-11-29 ENCOUNTER — Other Ambulatory Visit: Payer: Self-pay | Admitting: Family Medicine

## 2019-11-29 NOTE — Telephone Encounter (Signed)
Dr. Sullivan Lone patient requesting a refill on his omeprazole. L.O.V. was on 03/30/2018, please advise.

## 2019-11-29 NOTE — Telephone Encounter (Signed)
Requested medication (s) are due for refill today:yes  Requested medication (s) are on the active medication list: yes  Last refill:  10/25/2019  Future visit scheduled: no  Notes to clinic: no valid encounter in last 12 months    Requested Prescriptions  Pending Prescriptions Disp Refills   omeprazole (PRILOSEC) 40 MG capsule [Pharmacy Med Name: OMEPRAZOLE DR 40 MG CAPSULE] 30 capsule 0    Sig: TAKE (1) CAPSULE DAILY.      Gastroenterology: Proton Pump Inhibitors Failed - 11/29/2019  2:04 PM      Failed - Valid encounter within last 12 months    Recent Outpatient Visits           1 year ago Annual physical exam   [facility] Maple Hudson., MD   2 years ago Annual physical exam   [facility] Maple Hudson., MD

## 2020-06-23 ENCOUNTER — Other Ambulatory Visit: Payer: Self-pay | Admitting: Family Medicine

## 2020-06-23 NOTE — Telephone Encounter (Signed)
Requested medication (s) are due for refill today: no  Requested medication (s) are on the active medication list: yes  Last refill:  04/18/2020  Future visit scheduled: no  Notes to clinic:  patient is overdue for follow up appointment    Requested Prescriptions  Pending Prescriptions Disp Refills   lisinopril (ZESTRIL) 40 MG tablet [Pharmacy Med Name: LISINOPRIL 40 MG TABLET] 90 tablet 0    Sig: TAKE (1) TABLET DAILY FOR HIGH BLOOD PRESSURE.      Cardiovascular:  ACE Inhibitors Failed - 06/23/2020  1:38 PM      Failed - Cr in normal range and within 180 days    Creatinine, Ser  Date Value Ref Range Status  03/30/2018 1.12 0.76 - 1.27 mg/dL Final          Failed - K in normal range and within 180 days    Potassium  Date Value Ref Range Status  03/30/2018 5.2 3.5 - 5.2 mmol/L Final          Failed - Valid encounter within last 6 months    Recent Outpatient Visits           2 years ago Annual physical exam   [facility] Maple Hudson., MD   3 years ago Annual physical exam   [facility] Maple Hudson., MD              Passed - Patient is not pregnant      Passed - Last BP in normal range    BP Readings from Last 1 Encounters:  03/30/18 118/70

## 2020-10-16 ENCOUNTER — Other Ambulatory Visit: Payer: Self-pay | Admitting: Family Medicine

## 2020-10-16 NOTE — Telephone Encounter (Signed)
Patient called, left VM to return the call to the office for an appointment.  

## 2022-12-03 ENCOUNTER — Telehealth: Payer: Self-pay | Admitting: Family Medicine

## 2022-12-03 NOTE — Telephone Encounter (Signed)
Please contact patient to discuss recommendation from [facility]' attempt to contact him for repeat endoscopy for hx of Barrett's esophagus and increased risk of esophageal cancer.   Please have patient call [phone] to schedule   Eulis Foster, MD  [facility]

## 2022-12-04 NOTE — Telephone Encounter (Signed)
Patient hasn't been seen since 2019. Patient is past due for physical exam and Hypertension follow-up.  LM advising to call GI office or to call our office to get information and also to schedule appointment.
